# Patient Record
Sex: Male | Born: 1990 | Race: White | Hispanic: No | Smoking: Never smoker
Health system: Southern US, Community
[De-identification: ages and names within clinical notes are randomized; demographics above are authoritative.]

## PROBLEM LIST (undated history)

## (undated) HISTORY — PX: OTHER SURGICAL HISTORY: SHX169

---

## 2014-10-30 ENCOUNTER — Emergency Department (HOSPITAL_BASED_OUTPATIENT_CLINIC_OR_DEPARTMENT_OTHER): Payer: BLUE CROSS/BLUE SHIELD

## 2014-10-30 ENCOUNTER — Encounter (HOSPITAL_BASED_OUTPATIENT_CLINIC_OR_DEPARTMENT_OTHER): Payer: Self-pay | Admitting: Emergency Medicine

## 2014-10-30 ENCOUNTER — Emergency Department (HOSPITAL_BASED_OUTPATIENT_CLINIC_OR_DEPARTMENT_OTHER)
Admission: EM | Admit: 2014-10-30 | Discharge: 2014-10-30 | Disposition: A | Discharge status: Home / Self Care | Payer: BLUE CROSS/BLUE SHIELD | Attending: Emergency Medicine | Admitting: Emergency Medicine

## 2014-10-30 DIAGNOSIS — R079 Chest pain, unspecified: Secondary | ICD-10-CM | POA: Diagnosis present

## 2014-10-30 DIAGNOSIS — R0789 Other chest pain: Secondary | ICD-10-CM | POA: Insufficient documentation

## 2014-10-30 MED ORDER — IBUPROFEN 400 MG PO TABS
600.0000 mg | ORAL_TABLET | Freq: Once | ORAL | Status: AC
  Administered 2014-10-30: 600 mg via ORAL
  Filled 2014-10-30 (×2): qty 1

## 2014-10-30 NOTE — ED Notes (Signed)
Pt states for past 2 nights he has woken up from sleeping with a pulsing heaviness in his mid sternal chest and feels like he can't breathe. Pt states feeling lasts about 3-4 seconds then goes away, then returns, then subsides.

## 2014-10-30 NOTE — ED Provider Notes (Signed)
CSN: [id number]     Arrival date & time 10/30/14  0747 History   First MD Initiated Contact with Patient 10/30/14 [id number]     Chief Complaint  Patient presents with  . Chest Pain     (Consider location/radiation/quality/duration/timing/severity/associated sxs/prior Treatment) Patient is a 24 y.o. male presenting with chest pain.  Chest Pain Chest pain location: sternal. Pain quality: sharp   Pain radiates to:  Does not radiate Pain radiates to the back: no   Pain severity:  Mild Onset quality:  Gradual Duration:  2 days Timing:  Sporadic Progression:  Resolved Chronicity:  New Context: at rest   Relieved by:  Nothing Worsened by:  Nothing tried Ineffective treatments:  None tried Associated symptoms: no abdominal pain, no fever and no heartburn     History reviewed. No pertinent past medical history. Past Surgical History  Procedure Laterality Date  . Kidney stones removed     No family history on file. Social History  Substance Use Topics  . Smoking status: Never Smoker   . Smokeless tobacco: None  . Alcohol Use: No    Review of Systems  Constitutional: Negative for fever.  Cardiovascular: Positive for chest pain.  Gastrointestinal: Negative for heartburn and abdominal pain.  All other systems reviewed and are negative.     Allergies  Review of patient's allergies indicates no known allergies.  Home Medications   Prior to Admission medications   Not on File   BP 141/89 mmHg  Pulse 66  Temp(Src) 98.3 F (36.8 C) (Oral)  Resp 18  SpO2 100% Physical Exam  Constitutional: He is oriented to person, place, and time. He appears well-developed and well-nourished. No distress.  HENT:  Head: Normocephalic and atraumatic.  Eyes: Conjunctivae are normal.  Neck: Neck supple. No tracheal deviation present.  Cardiovascular: Normal rate, regular rhythm and normal heart sounds.   Pulmonary/Chest: Effort normal and breath sounds normal. No respiratory distress. He  exhibits no tenderness.  Abdominal: Soft. He exhibits no distension.  Neurological: He is alert and oriented to person, place, and time.  Skin: Skin is warm and dry.  Psychiatric: He has a normal mood and affect.    ED Course  Procedures (including critical care time) Labs Review Labs Reviewed - No data to display  Imaging Review Dg Chest 2 View  10/30/2014   CLINICAL DATA:  Chest pain.  EXAM: CHEST  2 VIEW  COMPARISON:  None.  FINDINGS: The heart size and mediastinal contours are within normal limits. Both lungs are clear. No pneumothorax or pleural effusion is noted. The visualized skeletal structures are unremarkable.  IMPRESSION: No active cardiopulmonary disease.   Electronically Signed   By: Lupita Raider, M.D.   On: 10/30/2014 08:19   I have personally reviewed and evaluated these images and lab results as part of my medical decision-making.   EKG Interpretation   Date/Time:  Sunday October 30 2014 07:56:01 EDT Ventricular Rate:  71 PR Interval:  156 QRS Duration: 90 QT Interval:  368 QTC Calculation: 399 R Axis:   70 Text Interpretation:  Normal sinus rhythm with sinus arrhythmia Normal ECG  Confirmed by Misaki Sozio MD, Tamiyah Moulin ([id number]) on 10/30/2014 7:55:32 AM      MDM   Final diagnoses:  Chest wall pain    24 year old male presents with intermittent sternal sharp chest pain that is atypical in nature. Low risk for ACS, PERC negative. Screening EKG and chest x-ray are unremarkable. Appears to be related to MSK pain. Discharge with  primary care follow-up and NSAIDs.    Lyndal Pulley, MD 10/30/14 [phone]

## 2014-10-30 NOTE — Discharge Instructions (Signed)

## 2016-07-05 IMAGING — CR DG CHEST 2V
2 series · 2 of 2 positions shown · non-contrast
Comparison: None.

CLINICAL DATA: Chest pain.

EXAM:
CHEST  2 VIEW

[w chest pa]
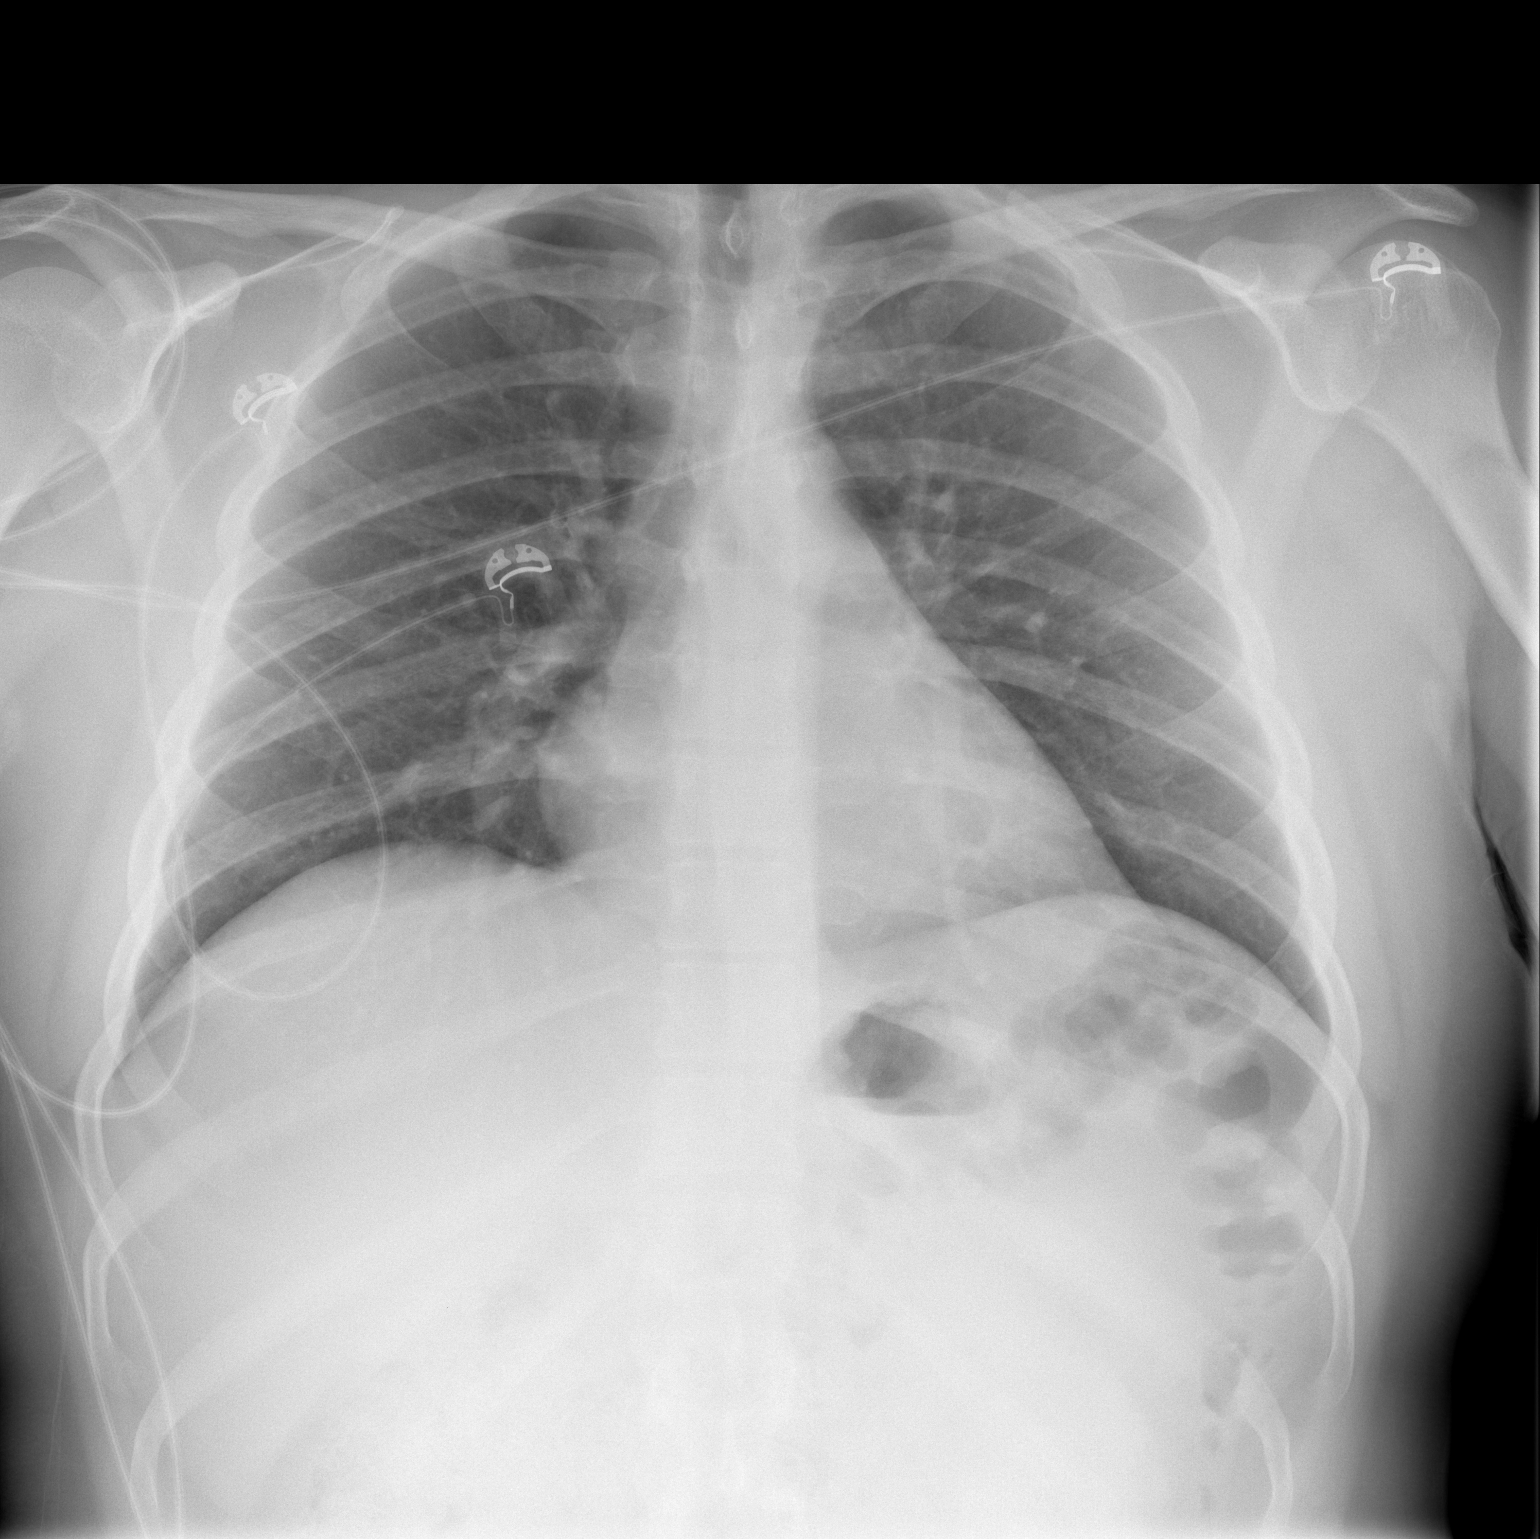

[w chest lat]
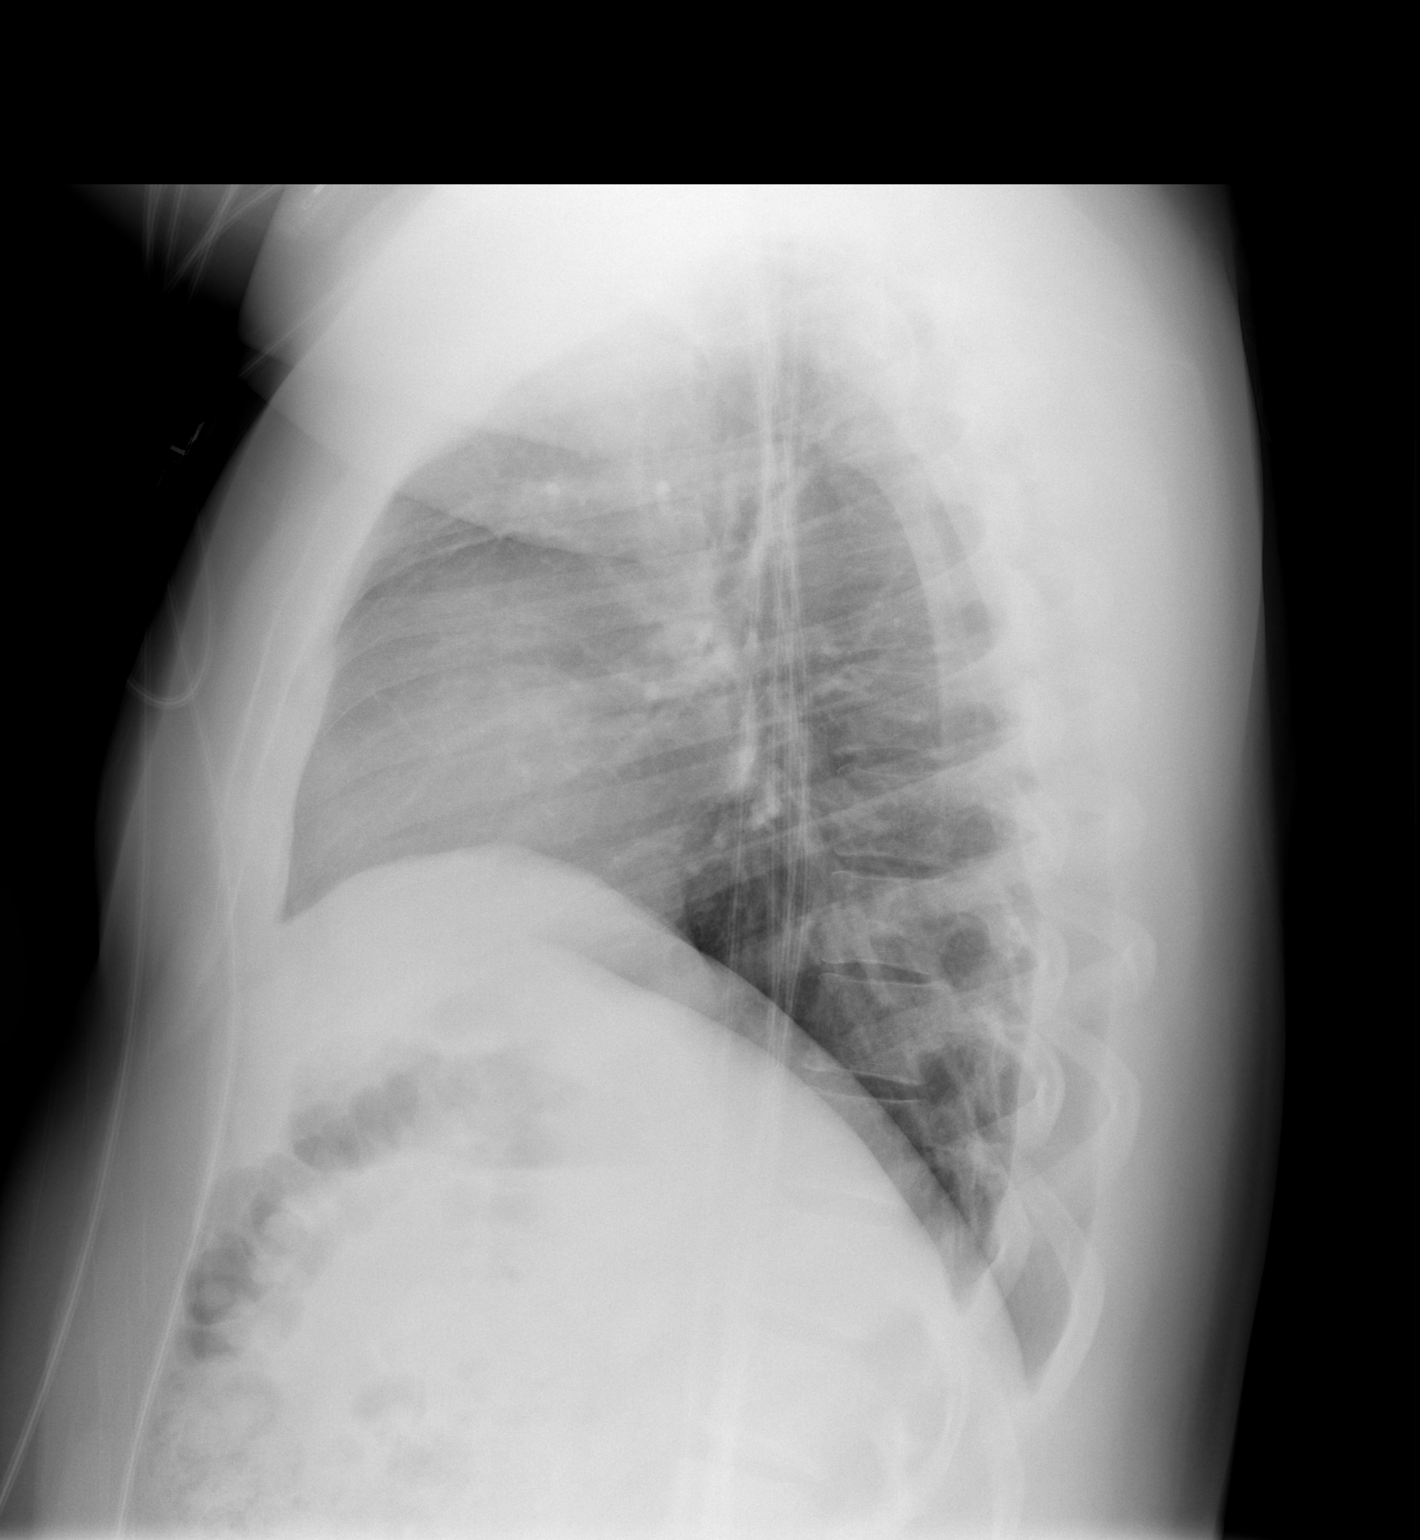

[2 of 2 positions shown; findings below may reference images not displayed]

FINDINGS: The heart size and mediastinal contours are within normal limits.
Both lungs are clear. No pneumothorax or pleural effusion is noted.
The visualized skeletal structures are unremarkable.
IMPRESSION: No active cardiopulmonary disease.
